# Patient Record
Sex: Female | Born: 2000 | Race: White | Hispanic: No | Marital: Single | Smoking: Never smoker
Health system: Southern US, Community
[De-identification: ages and names within clinical notes are randomized; demographics above are authoritative.]

## PROBLEM LIST (undated history)

## (undated) DIAGNOSIS — L709 Acne, unspecified: Secondary | ICD-10-CM

## (undated) DIAGNOSIS — J45909 Unspecified asthma, uncomplicated: Secondary | ICD-10-CM

## (undated) DIAGNOSIS — T783XXA Angioneurotic edema, initial encounter: Secondary | ICD-10-CM

## (undated) DIAGNOSIS — L309 Dermatitis, unspecified: Secondary | ICD-10-CM

## (undated) DIAGNOSIS — L509 Urticaria, unspecified: Secondary | ICD-10-CM

## (undated) HISTORY — DX: Angioneurotic edema, initial encounter: T78.3XXA

## (undated) HISTORY — PX: TYMPANOSTOMY TUBE PLACEMENT: SHX32

## (undated) HISTORY — PX: ADENOIDECTOMY: SUR15

## (undated) HISTORY — DX: Acne, unspecified: L70.9

## (undated) HISTORY — DX: Unspecified asthma, uncomplicated: J45.909

## (undated) HISTORY — DX: Dermatitis, unspecified: L30.9

## (undated) HISTORY — DX: Urticaria, unspecified: L50.9

---
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
## 2022-03-13 ENCOUNTER — Encounter: Payer: Self-pay | Admitting: Dermatology

## 2022-03-13 ENCOUNTER — Ambulatory Visit: Payer: 59 | Admitting: Dermatology

## 2022-03-13 ENCOUNTER — Other Ambulatory Visit: Payer: Self-pay

## 2022-03-13 DIAGNOSIS — L700 Acne vulgaris: Secondary | ICD-10-CM

## 2022-03-13 MED ORDER — AMZEEQ 4 % EX FOAM: 1.0000 "application " | Freq: Every day | CUTANEOUS | 6 refills | Status: DC

## 2022-04-01 ENCOUNTER — Encounter: Payer: Self-pay | Admitting: Dermatology

## 2022-04-01 NOTE — Progress Notes (Signed)
? ?  Follow-Up Visit ?  ?Subjective  ?Sabrina Ward is a 21 y.o. female who presents for the following: Skin Problem (Pt states she has really dry skin. Pt states she has some lesions on her face that look like acne spot that wont go away. ). ? ?Facial breaking out in individual with slight dry sensitive skin ?Location:  ?Duration:  ?Quality:  ?Associated Signs/Symptoms: ?Modifying Factors:  ?Severity:  ?Timing: ?Context:  ? ?Objective  ?Well appearing patient in no apparent distress; mood and affect are within normal limits. ?Head - Anterior (Face) ?Mostly superficial inflammatory acne in a patient with historical dry sensitive skin.  Multiple treatment options reviewed. ? ? ? ?A focused examination was performed including head and neck.. Relevant physical exam findings are noted in the Assessment and Plan. ? ? ?Assessment & Plan  ? ? ?Acne vulgaris ?Head - Anterior (Face) ? ?Otc purpose cream.  Topical Amzeeq to areas prone to breakout nightly.  Recheck as needed in 6 weeks. ? ?Minocycline HCl Micronized (AMZEEQ) 4 % FOAM - Head - Anterior (Face) ?Apply 1 application. topically daily. ? ?Related Medications ?minocycline (MINOCIN) 100 MG capsule ?Take 1 capsule (100 mg total) by mouth in the morning and at bedtime. ? ? ? ? ? ?I, Lavonna Monarch, MD, have reviewed all documentation for this visit.  The documentation on 04/01/22 for the exam, diagnosis, procedures, and orders are all accurate and complete. ?

## 2022-05-13 ENCOUNTER — Ambulatory Visit: Payer: 59 | Admitting: Dermatology

## 2022-05-13 ENCOUNTER — Encounter: Payer: Self-pay | Admitting: Dermatology

## 2022-05-13 DIAGNOSIS — L700 Acne vulgaris: Secondary | ICD-10-CM | POA: Diagnosis not present

## 2022-05-13 DIAGNOSIS — Z1283 Encounter for screening for malignant neoplasm of skin: Secondary | ICD-10-CM

## 2022-06-03 ENCOUNTER — Encounter: Payer: Self-pay | Admitting: Dermatology

## 2022-06-03 NOTE — Progress Notes (Signed)
   Follow-Up Visit   Subjective  Sabrina Ward is a 21 y.o. female who presents for the following: Acne (Acne follow up [place] working ).  Follow-up acne, check a few other spots Location:  Duration:  Quality:  Associated Signs/Symptoms: Modifying Factors:  Severity:  Timing: Context:   Objective  Well appearing patient in no apparent distress; mood and affect are within normal limits. No atypical nevi or signs of NMSC noted at the time of the visit. Waist up exam.  Head - Anterior (Face) A mostly superficial inflammatory acne, perhaps 80% improved.  Patient pleased with response to Amzeeq.    All skin waist up examined.  Areas beneath undergarments not examined.   Assessment & Plan    Acne vulgaris Head - Anterior (Face)  Continue amzeeq no changes.  To contact us if there is any issue with the medication.  Related Medications Minocycline HCl Micronized (AMZEEQ) 4 % FOAM Apply 1 application. topically daily.  Screening exam for skin cancer      I, Sabrina Monarch, MD, have reviewed all documentation for this visit.  The documentation on 06/03/22 for the exam, diagnosis, procedures, and orders are all accurate and complete.

## 2023-06-05 ENCOUNTER — Other Ambulatory Visit: Payer: Self-pay

## 2023-06-05 ENCOUNTER — Ambulatory Visit (INDEPENDENT_AMBULATORY_CARE_PROVIDER_SITE_OTHER): Payer: 59 | Admitting: Allergy & Immunology

## 2023-06-05 ENCOUNTER — Encounter: Payer: Self-pay | Admitting: Allergy & Immunology

## 2023-06-05 VITALS — BP 108/70 | HR 90 | Temp 97.6°F | Resp 18 | Ht 65.9 in | Wt 144.1 lb

## 2023-06-05 DIAGNOSIS — J310 Chronic rhinitis: Secondary | ICD-10-CM

## 2023-06-05 DIAGNOSIS — J4540 Moderate persistent asthma, uncomplicated: Secondary | ICD-10-CM

## 2023-06-05 DIAGNOSIS — L2089 Other atopic dermatitis: Secondary | ICD-10-CM | POA: Diagnosis not present

## 2023-06-05 MED ORDER — AIRSUPRA 90-80 MCG/ACT IN AERO: 2.0000 | INHALATION_SPRAY | RESPIRATORY_TRACT | 2 refills | Status: AC | PRN

## 2023-06-05 MED ORDER — CLOBETASOL PROPIONATE 0.05 % EX OINT: 1.0000 | TOPICAL_OINTMENT | Freq: Two times a day (BID) | CUTANEOUS | 1 refills | Status: AC

## 2023-06-05 MED ORDER — ALBUTEROL SULFATE (2.5 MG/3ML) 0.083% IN NEBU: 2.5000 mg | INHALATION_SOLUTION | RESPIRATORY_TRACT | 1 refills | Status: AC | PRN

## 2023-06-05 NOTE — Patient Instructions (Addendum)
1. Moderate persistent asthma, uncomplicated - Lung testing looks great today.  - We are going to give you a sample of AirSupra. - This contains an albuterol combined with an inhaled steroid to help with breathing problems.  - This can help prevent any exacerbations.  - Daily controller medication(s): NOTHING - Prior to physical activity: AirSupra two puffs 15 minutes before physical activity  - Rescue medications: AirSupra two puffs every 4-6 hours as needed  - Asthma control goals:  * Full participation in all desired activities (may need albuterol before activity) * Albuterol use two time or less a week on average (not counting use with activity) * Cough interfering with sleep two time or less a month * Oral steroids no more than once a year * No hospitalizations   2. Chronic urticaria - resolved - This has all resolved.    3. Recurrent infections - You responded to the Pneumovax very well.   4. Eczema - We are going to start clobetasol twice daily on the rash on the arms. - We are going to do patch testing to look for sensitizations to chemicals and cosmetics and such.   5. Follow up for patch testing.    Please inform us of any Emergency Department visits, hospitalizations, or changes in symptoms. Call us before going to the ED for breathing or allergy symptoms since we might be able to fit you in for a sick visit. Feel free to contact us anytime with any questions, problems, or concerns.  It was a pleasure to see you guys today!  Websites that have reliable patient information: 1. American Academy of Asthma, Allergy, and Immunology: www.aaaai.org 2. Food Allergy Research and Education (FARE): foodallergy.org 3. Mothers of Asthmatics: http://www.asthmacommunitynetwork.org 4. American College of Allergy, Asthma, and Immunology: www.acaai.org   COVID-19 Vaccine Information can be found at: [email or web address] For  questions related to vaccine distribution or appointments, please email [email or web address] or call [phone].   We realize that you might be concerned about having an allergic reaction to the COVID19 vaccines. To help with that concern, WE ARE OFFERING THE COVID19 VACCINES IN OUR OFFICE! Ask the front desk for dates!     "Like" [place] on Facebook and Instagram for our latest updates!      A healthy democracy works best when [employer] participate! Make sure you are registered to vote! If you have moved or changed any of your contact information, you will need to get this updated before voting!  In some cases, you MAY be able to register to vote online: [email or web address]       True Test looks for the following sensitivities:

## 2023-06-05 NOTE — Progress Notes (Signed)
FOLLOW UP  Date of Service/Encounter:  06/05/23   Assessment:   Moderate pesistent asthma, uncomplicated - really markedly improved since stopping splinting  Eczema - with possible coexisting contact dermatitis (planning for patch testing)   Chronic urticaria - resolved   Recurrent infections - with excellent response to Pneumovax   Multiple joint pains (back, hip, etc) - with normalized inflammatory markers (referred to Rheumatology, but unclear if they ever made it    Plan/Recommendations:   1. Moderate persistent asthma, uncomplicated - Lung testing looks great today.  - We are going to give you a sample of AirSupra. - This contains an albuterol combined with an inhaled steroid to help with breathing problems.  - This can help prevent any exacerbations.  - Daily controller medication(s): NOTHING - Prior to physical activity: AirSupra two puffs 15 minutes before physical activity  - Rescue medications: AirSupra two puffs every 4-6 hours as needed  - Asthma control goals:  * Full participation in all desired activities (may need albuterol before activity) * Albuterol use two time or less a week on average (not counting use with activity) * Cough interfering with sleep two time or less a month * Oral steroids no more than once a year * No hospitalizations   2. Chronic urticaria - resolved - This has all resolved.    3. Recurrent infections - You responded to the Pneumovax very well.   4. Eczema - We are going to start clobetasol twice daily on the rash on the arms. - We are going to do patch testing to look for sensitizations to chemicals and cosmetics and such.   5. Follow up for patch testing.   Subjective:   Sabrina Ward is a 22 y.o. female presenting today for follow up of  Chief Complaint  Patient presents with   Rash   Urticaria   Food Intolerance    Gluten-bloating    Sabrina Ward has a history of the following: Patient  Active Problem List   Diagnosis Date Noted   Moderate persistent asthma, uncomplicated 12/27/2020   Chronic urticaria 12/27/2020   Recurrent infections 12/27/2020   Pain of left hip joint 06/23/2019   Low back pain 01/02/2019    History obtained from: chart review and patient and mother.  Sabrina Ward is a 22 y.o. female presenting for a follow up visit.  She was last seen in January 2022.  At that time, we did not do lung testing.  She was doing well on Singulair as well as Breo 100 mcg 1 puff once daily.  For her urticaria, it had resolved.  We continue with the Singulair 10 mg at night and obtain repeat inflammatory markers.  We also referred her to rheumatology.  For her recurrent infections, we obtained repeat streptococcal titers.  The repeat titers showed an excellent response to the Pneumovax.  Inflammatory markers were negative.   She is majoring in nursing. Mom is an empty nester but everything is great.  Sabrina Ward is planning to go to [place] as [place] and several other countries in [place] for 2 and half weeks with her aunt as well as her aunt's girlfriend and her aunts girlfriend's daughter.  They are going to be attending a [employer] in [place].  Asthma/Respiratory Symptom History: Asthma is essentially nonexistent.  She has not used Breo in a while.  She does have an outdated albuterol inhaler to use as needed.  She would like a refill on the albuterol.  When she stopped swimming, her symptoms all but resolved.  She does not need Breo.  Spirometry looks great today.  Allergic Rhinitis Symptom History: Allergic rhinitis is doing fairly well with an over-the-counter antihistamine as needed.  She is no longer taking Singulair.  She is no longer on Flonase either.  Skin Symptom History: She has a rash on her left arm. It came up two days ago.  This was bright red when it first popped up. Aquaphor was not working. She had an appointment with the Dermatologist yesterday. She  was told to stop using any facial creams. She was placed on the triamcinolone which cleared it up very nicely. They are still unsure what caused it. She did have eczema when she was much younger.  This new flare is a little bit different.  She actually finished Accutane in March 2024. She was on it for 6 months. This is the full course. She had a lot of rendess and rash on her neck. She treatted this with triamcinolone 0.025%. She had some bleeding under her eyes. She had some bad eczema breakouts.   Otherwise, there have been no changes to her past medical history, surgical history, family history, or social history.    Review of Systems  Constitutional: Negative.  Negative for chills, fever, malaise/fatigue and weight loss.  HENT: Negative.  Negative for congestion, ear discharge and ear pain.   Eyes:  Negative for pain, discharge and redness.  Respiratory:  Negative for cough, sputum production, shortness of breath and wheezing.   Cardiovascular: Negative.  Negative for chest pain and palpitations.  Gastrointestinal:  Negative for abdominal pain, blood in stool, constipation, diarrhea, heartburn, nausea and vomiting.  Musculoskeletal:  Positive for back pain and joint pain.  Skin: Negative.  Negative for itching and rash.  Neurological:  Negative for dizziness and headaches.  Endo/Heme/Allergies:  Negative for environmental allergies. Does not bruise/bleed easily.       Objective:   Blood pressure 108/70, pulse 90, temperature 97.6 F (36.4 C), resp. rate 18, height 5' 5.9" (1.674 m), weight 144 lb 2 oz (65.4 kg), SpO2 99 %. Body mass index is 23.33 kg/m.    Physical Exam Vitals reviewed.  Constitutional:      Appearance: She is well-developed.  HENT:     Head: Normocephalic and atraumatic.     Right Ear: Tympanic membrane, ear canal and external ear normal.     Left Ear: Tympanic membrane and ear canal normal.     Nose: No nasal deformity, septal deviation, mucosal edema  or rhinorrhea.     Right Sinus: No maxillary sinus tenderness or frontal sinus tenderness.     Left Sinus: No maxillary sinus tenderness or frontal sinus tenderness.     Mouth/Throat:     Mouth: Mucous membranes are not pale and not dry.     Pharynx: Uvula midline.  Eyes:     General:        Right eye: No discharge.        Left eye: No discharge.     Conjunctiva/sclera: Conjunctivae normal.     Right eye: Right conjunctiva is not injected. No chemosis.    Left eye: Left conjunctiva is not injected. No chemosis.    Pupils: Pupils are equal, round, and reactive to light.  Cardiovascular:     Rate and Rhythm: Normal rate and regular rhythm.     Heart sounds: Normal heart sounds.  Pulmonary:     Effort: Pulmonary effort is normal. No  tachypnea, accessory muscle usage or respiratory distress.     Breath sounds: Normal breath sounds. No wheezing, rhonchi or rales.  Chest:     Chest wall: No tenderness.  Lymphadenopathy:     Cervical: No cervical adenopathy.  Skin:    General: Skin is warm.     Capillary Refill: Capillary refill takes less than 2 seconds.     Coloration: Skin is not pale.     Findings: No abrasion, erythema, petechiae or rash. Rash is not papular, urticarial or vesicular.     Comments: Eczematous looking areas on the bilateral arms.  There are excoriations present.  No honey crusting or oozing.  Neurological:     Mental Status: She is alert.       Diagnostic studies:    Spirometry: results normal (FEV1: 3.25/94%, FVC: 4.42/112%, FEV1/FVC: 74%).    Spirometry consistent with normal pattern.   Allergy Studies: none        Malachi Bonds, MD  [facility]

## 2023-06-09 ENCOUNTER — Other Ambulatory Visit: Payer: Self-pay

## 2023-06-09 ENCOUNTER — Encounter: Payer: Self-pay | Admitting: Family Medicine

## 2023-06-09 ENCOUNTER — Ambulatory Visit: Payer: 59 | Admitting: Family Medicine

## 2023-06-09 VITALS — BP 112/70 | HR 83 | Temp 98.3°F | Resp 16 | Wt 146.9 lb

## 2023-06-09 DIAGNOSIS — L235 Allergic contact dermatitis due to other chemical products: Secondary | ICD-10-CM

## 2023-06-09 DIAGNOSIS — L259 Unspecified contact dermatitis, unspecified cause: Secondary | ICD-10-CM | POA: Insufficient documentation

## 2023-06-09 NOTE — Progress Notes (Signed)
Follow-up Note  RE: Sabrina Ward MRN: [id number] DOB: 13-Sep-2001 Date of Office Visit: 06/09/2023  Primary care provider: Soundra Pilon, FNP Referring provider: Soundra Pilon, FNP   Sabrina Ward returns to the office today for the patch test placement, given suspected history of contact dermatitis.    Diagnostics: True Test patches placed.   Plan:   Allergic contact dermatitis - Instructions provided on care of the patches for the next 48 hours. Sabrina Ward was instructed to avoid showering for the next 48 hours. Sabrina Ward will follow up in 48 hours and 96 hours for patch readings.    Call the clinic if this treatment plan is not working well for you  Follow up in 2 days or sooner if needed.

## 2023-06-09 NOTE — Patient Instructions (Addendum)
Diagnostics: True Test patches placed.   Plan:   Allergic contact dermatitis - Instructions provided on care of the patches for the next 48 hours. Sabrina Ward was instructed to avoid showering for the next 48 hours. Sabrina Ward will follow up in 48 hours and 96 hours for patch readings.    Call the clinic if this treatment plan is not working well for you  Follow up in 2 days or sooner if needed.

## 2023-06-10 NOTE — Progress Notes (Unsigned)
   Follow Up Note  RE: Emily Crooker MRN: [id number] DOB: 2001/05/22 Date of Office Visit: 06/11/2023  Referring provider: Soundra Pilon, FNP Primary care provider: Soundra Pilon, FNP  History of Present Illness: I had the pleasure of seeing Herbert Kleve for a follow up visit at the [facility] on 06/11/2023. She is a 22 y.o. female, here for initial patch test interpretation, given suspected history of contact dermatitis.   Diagnostics:   TRUE TEST 48 hour reading: +/- gold, otherwise negative  T.R.U.E. Test - 06/11/23 1500       Test Information   Number of Test 36    Reading Interval Day 3      Panel 1   1. Nickel Sulfate 0    2. Wool Alcohols 0    3. Neomycin Sulfate 0    4. Potassium Dichromate 0    5. Caine Mix 0    6. Fragrance Mix 0    7. Colophony 0    8. Paraben Mix 0    9. Negative Control 0    10. Balsam of [place] 0    11. Ethylenediamine Dihydrochloride 0    12. Cobalt Dichloride 0      Panel 2   13. p-tert Butylphenol Formaldehyde Resin 0    14. Epoxy Resin 0    15. Carba Mix 0    16.  Black Rubber Mix 0    17. Cl+ Me-Isothiazolinone 0    18. Quaternium-15 0    19. Methyldibromo Glutaronitrile 0    20. p-Phenylenediamine 0    21. Formaldehyde 0    22. Mercapto Mix 0    23. Thimerosal 0    24. Thiuram Mix 0      Panel 3   25. Diazolidinyl Urea 0    26. Quinoline Mix 0    27. Tixocortol-21-Pivalate 0    28. Gold Sodium Thiosulfate --   +/-   29. Imidazolidinyl Urea 0    30. Budesonide 0    31. Hydrocortisone-17-Butyrate 0    32. Mercaptobenzothiazole 0    33. Bacitracin 0    34. Parthenolide 0    35. Disperse Blue 106 0    36. 2-Bromo-2-Nitropropane-1,3-diol 0              Assessment and Plan: Morrison is a 22 y.o. female with: Concern for Contact Dermatitis:  Patient aware to return Friday for final read Today's read questionable positive to gold (one small papule)  It was my pleasure to see Maybellene  today and participate in her care. Please feel free to contact me with any questions or concerns.  Sincerely,   Tonny Bollman, MD [facility]

## 2023-06-11 ENCOUNTER — Encounter: Payer: Self-pay | Admitting: Internal Medicine

## 2023-06-11 ENCOUNTER — Ambulatory Visit (INDEPENDENT_AMBULATORY_CARE_PROVIDER_SITE_OTHER): Payer: 59 | Admitting: Internal Medicine

## 2023-06-11 DIAGNOSIS — L235 Allergic contact dermatitis due to other chemical products: Secondary | ICD-10-CM

## 2023-06-13 ENCOUNTER — Ambulatory Visit (INDEPENDENT_AMBULATORY_CARE_PROVIDER_SITE_OTHER): Payer: 59 | Admitting: Internal Medicine

## 2023-06-13 DIAGNOSIS — L2089 Other atopic dermatitis: Secondary | ICD-10-CM | POA: Diagnosis not present

## 2023-06-13 DIAGNOSIS — L235 Allergic contact dermatitis due to other chemical products: Secondary | ICD-10-CM

## 2023-06-13 NOTE — Patient Instructions (Addendum)
Negative patch testing today- TRUE test.  Please test products one at a time behind the ear for about 1-1.5 weeks prior to adding it to your routine.  For now discontinue all facial products and try testing them behind the ear one at a time.  If no reaction, okay to use on face.     Follow up with Dr. Dellis Anes in about 3 months.   Return in about 3 months (around 09/13/2023).   Sabrina Ward

## 2023-06-13 NOTE — Progress Notes (Signed)
   Follow Up Note  RE: Sabrina Ward MRN: [id number] DOB: Dec 07, 2001 Date of Office Visit: 06/13/2023  Referring provider: Soundra Pilon, FNP Primary care provider: Soundra Pilon, FNP  History of Present Illness: I had the pleasure of seeing Sabrina Ward for a follow up visit at the [facility] on 06/13/2023. She is a 22 y.o. female, who is being followed for contact dermatitis. Today she is here for final patch test interpretation, given suspected history of contact dermatitis.   Has noted improvement on facial rash after stopping all her products: cerave cleanser and cetaphil moisturizer.  Also has had a reaction to mascara in the past.   Diagnostics:  TRUE TEST 96 hour reading:    Sabrina Ward returns to the office today for the final patch test interpretation, given suspected history of contact dermatitis.    Diagnostics:   TRUE TEST 96-hour hour reading: all negative Last visit, gold was +/-    Assessment and Plan: Sabrina Ward is a 22 y.o. female with: Concern for Contact Dermatitis:  The patient has been provided detailed information regarding the substances she is sensitive to, as well as products containing the substances.  Meticulous avoidance of these substances is recommended. If avoidance is not possible, the use of barrier creams or lotions is recommended. If symptoms persist or progress despite meticulous avoidance of chemicals/substances above, dermatology evaluation may be warranted. Negative patch testing today- TRUE test. Please test products one at a time behind the ear for about 1-1.5 weeks prior to adding it to your routine.  For now discontinue all facial products and try testing them behind the ear one at a time.  If no reaction, okay to use on face.    Follow up in 3 months for routine visit.  It was my pleasure to see Sabrina Ward today and participate in her care. Please feel free to contact me with any questions or  concerns.  Sincerely,   Alesia Morin, MD [facility]
